# Patient Record
Sex: Female | Born: 1975 | Race: White | Hispanic: No | Marital: Single | Smoking: Current every day smoker
Health system: Southern US, Community
[De-identification: ages and names within clinical notes are randomized; demographics above are authoritative.]

## PROBLEM LIST (undated history)

## (undated) DIAGNOSIS — E039 Hypothyroidism, unspecified: Secondary | ICD-10-CM

## (undated) DIAGNOSIS — F32A Depression, unspecified: Secondary | ICD-10-CM

## (undated) HISTORY — DX: Depression, unspecified: F32.A

## (undated) HISTORY — DX: Hypothyroidism, unspecified: E03.9

---

## 2017-09-04 ENCOUNTER — Emergency Department
Admission: EM | Admit: 2017-09-04 | Discharge: 2017-09-04 | Disposition: A | Discharge status: Home / Self Care | Payer: No Typology Code available for payment source | Attending: Emergency Medicine | Admitting: Emergency Medicine

## 2017-09-04 DIAGNOSIS — Z79899 Other long term (current) drug therapy: Secondary | ICD-10-CM | POA: Insufficient documentation

## 2017-09-04 DIAGNOSIS — Z885 Allergy status to narcotic agent status: Secondary | ICD-10-CM | POA: Insufficient documentation

## 2017-09-04 DIAGNOSIS — F17210 Nicotine dependence, cigarettes, uncomplicated: Secondary | ICD-10-CM | POA: Insufficient documentation

## 2017-09-04 DIAGNOSIS — K3184 Gastroparesis: Secondary | ICD-10-CM | POA: Insufficient documentation

## 2017-09-04 DIAGNOSIS — G8929 Other chronic pain: Secondary | ICD-10-CM | POA: Insufficient documentation

## 2017-09-04 DIAGNOSIS — E039 Hypothyroidism, unspecified: Secondary | ICD-10-CM | POA: Insufficient documentation

## 2017-09-04 LAB — COMPREHENSIVE METABOLIC PANEL
ALT: 13 U/L (ref 0–55)
AST (SGOT): 13 U/L (ref 5–34)
Albumin/Globulin Ratio: 1.4 (ref 0.9–2.2)
Albumin: 3.8 g/dL (ref 3.5–5.0)
Alkaline Phosphatase: 77 U/L (ref 37–106)
Anion Gap: 11 (ref 5.0–15.0)
BUN: 9.4 mg/dL (ref 7.0–19.0)
Bilirubin, Total: 0.1 mg/dL — ABNORMAL LOW (ref 0.2–1.2)
CO2: 26 mEq/L (ref 22–29)
Calcium: 9.5 mg/dL (ref 8.5–10.5)
Chloride: 104 mEq/L (ref 100–111)
Creatinine: 0.7 mg/dL (ref 0.6–1.0)
Globulin: 2.8 g/dL (ref 2.0–3.6)
Glucose: 83 mg/dL (ref 70–100)
Potassium: 3.7 mEq/L (ref 3.5–5.1)
Protein, Total: 6.6 g/dL (ref 6.0–8.3)
Sodium: 141 mEq/L (ref 136–145)

## 2017-09-04 LAB — CBC AND DIFFERENTIAL
Absolute NRBC: 0 10*3/uL
Basophils Absolute Automated: 0.03 10*3/uL (ref 0.00–0.20)
Basophils Automated: 0.4 %
Eosinophils Absolute Automated: 0.16 10*3/uL (ref 0.00–0.70)
Eosinophils Automated: 2.1 %
Hematocrit: 39.8 % (ref 37.0–47.0)
Hgb: 13.1 g/dL (ref 12.0–16.0)
Immature Granulocytes Absolute: 0.01 10*3/uL
Immature Granulocytes: 0.1 %
Lymphocytes Absolute Automated: 2.97 10*3/uL (ref 0.50–4.40)
Lymphocytes Automated: 39.1 %
MCH: 31.8 pg (ref 28.0–32.0)
MCHC: 32.9 g/dL (ref 32.0–36.0)
MCV: 96.6 fL (ref 80.0–100.0)
MPV: 9.8 fL (ref 9.4–12.3)
Monocytes Absolute Automated: 0.55 10*3/uL (ref 0.00–1.20)
Monocytes: 7.2 %
Neutrophils Absolute: 3.87 10*3/uL (ref 1.80–8.10)
Neutrophils: 51.1 %
Nucleated RBC: 0 /100 WBC (ref 0.0–1.0)
Platelets: 279 10*3/uL (ref 140–400)
RBC: 4.12 10*6/uL — ABNORMAL LOW (ref 4.20–5.40)
RDW: 13 % (ref 12–15)
WBC: 7.59 10*3/uL (ref 3.50–10.80)

## 2017-09-04 LAB — URINALYSIS
Bilirubin, UA: NEGATIVE
Blood, UA: NEGATIVE
Glucose, UA: NEGATIVE
Ketones UA: NEGATIVE
Leukocyte Esterase, UA: NEGATIVE
Nitrite, UA: NEGATIVE
Protein, UR: NEGATIVE
Specific Gravity UA: 1.025 (ref 1.001–1.035)
Urine pH: 6.5 (ref 5.0–8.0)
Urobilinogen, UA: 0.2 mg/dL

## 2017-09-04 LAB — GFR: EGFR: >60

## 2017-09-04 LAB — URINE HCG QUALITATIVE: Urine HCG Qualitative: NEGATIVE

## 2017-09-04 MED ORDER — DIPHENHYDRAMINE HCL 50 MG/ML IJ SOLN
INTRAMUSCULAR | Status: AC
Start: 2017-09-04 — End: 2017-09-04
  Administered 2017-09-04: 20:00:00 25 mg via INTRAVENOUS
  Filled 2017-09-04: qty 1

## 2017-09-04 MED ORDER — DIPHENHYDRAMINE HCL 25 MG PO CAPS
25.00 mg | ORAL_CAPSULE | Freq: Once | ORAL | Status: DC
Start: 2017-09-04 — End: 2017-09-04

## 2017-09-04 MED ORDER — DIPHENHYDRAMINE HCL 50 MG/ML IJ SOLN
12.50 mg | Freq: Once | INTRAMUSCULAR | Status: DC
Start: 2017-09-04 — End: 2017-09-04

## 2017-09-04 MED ORDER — KETOROLAC TROMETHAMINE 30 MG/ML IJ SOLN
30.00 mg | Freq: Once | INTRAMUSCULAR | Status: AC
Start: 2017-09-04 — End: 2017-09-04
  Administered 2017-09-04: 20:00:00 30 mg via INTRAVENOUS
  Filled 2017-09-04: qty 1

## 2017-09-04 MED ORDER — METOCLOPRAMIDE HCL 5 MG/ML IJ SOLN
10.00 mg | Freq: Once | INTRAMUSCULAR | Status: AC
Start: 2017-09-04 — End: 2017-09-04
  Administered 2017-09-04: 20:00:00 10 mg via INTRAVENOUS
  Filled 2017-09-04: qty 2

## 2017-09-04 MED ORDER — SODIUM CHLORIDE 0.9 % IV BOLUS
1000.00 mL | Freq: Once | INTRAVENOUS | Status: AC
Start: 2017-09-04 — End: 2017-09-04
  Administered 2017-09-04: 20:00:00 1000 mL via INTRAVENOUS

## 2017-09-04 MED ORDER — DIPHENHYDRAMINE HCL 50 MG/ML IJ SOLN
25.00 mg | Freq: Once | INTRAMUSCULAR | Status: AC
Start: 2017-09-04 — End: 2017-09-04

## 2017-09-04 MED ORDER — DIPHENHYDRAMINE HCL 50 MG/ML IJ SOLN
25.00 mg | Freq: Once | INTRAMUSCULAR | Status: DC
Start: 2017-09-04 — End: 2017-09-04

## 2017-09-04 NOTE — Discharge Instructions (Signed)
Your blood tests in the ER were normal.    Stay well-hydrated and get plenty of rest.    Use home nausea medications as needed.    We recommended smaller meals throughout the day since you have recently been diagnosed with gastroparesis.    See your primary care doctor and GI doctor for follow up in 2-3 days    Return to the ER for any further concerns.          Gastroparesis    Gastroparesis means that it takes longer than usual for food to move from your stomach into your intestines. It happens when the nerves that control the stomach muscles are damaged. The nerve damage could be caused by diabetes, surgery to the stomach or intestines, or by some medicines. Sometimes we cannot figure out the cause.    Gastroparesis is usually a chronic condition. This means treatment is very unlikely to cure it. Instead, your doctor can help you manage the symptoms.    Gastroparesis is usually a chronic condition. This means treatment is very unlikely to cure it. Instead, your doctor can help you manage the symptoms.    Symptoms of gastroparesis include:   Abdominal (belly) pain or cramping, especially in the upper abdomen (belly).   Nausea or vomiting.   Feeling full after eating only a small amount of food.   Heartburn or acid reflux.   High or low blood sugars which are hard to control.    To feel better, try to:   Drink more liquids and eat soft foods.   Drink plenty of water.   Eat smaller, more frequent meals.   Avoid high-fat foods.   Avoid high fiber foods.    Follow up with your primary care doctor or your stomach specialist.    YOU SHOULD SEEK MEDICAL ATTENTION IMMEDIATELY, EITHER HERE OR AT THE NEAREST EMERGENCY DEPARTMENT, IF ANY OF THE FOLLOWING OCCUR:   Your pain changes or gets worse.   You have problems eating or drinking.   You have vomiting that won't stop.   Your symptoms get worse or you have any other problems or concerns.

## 2017-09-04 NOTE — ED Provider Notes (Signed)
[facility] EMERGENCY DEPARTMENT H&P      Visit date: 09/04/2017      CLINICAL SUMMARY           Diagnosis:    .     Final diagnoses:   Gastroparesis         MDM Notes:      42 yo F h/o recently dx'd gastroparesis, p/w worsening of chronic abd pain and n/v/d.  Labs wnl.  Pt given IV Reglan, Benadryl, and Toradol with improvement in sx.  Discussed home supportive care, recommend close PCP and GI f/u.         Disposition:         Discharge         Discharge Prescriptions     None                      CLINICAL INFORMATION        HPI:      Chief Complaint: Abdominal Pain  .    Kathy Lester is a 41 y.o. female who presents with c/o intermittent nausea and abdominal pain over the past 5 months, acutely worse over the past 4 days. Pt reports recent dx of gastroparesis and reports near constant pain centered in LLQ abdomen due to the gastroparesis. She also c/o vomiting, chills, and diarrhea over the past 5 days and expresses concern over dehydration.     She denies measured fever, CP, SOB, HA, or numbness/tingling.     Tried Zofran and Phenergan at home, without relief of sx.    History obtained from: Patient          ROS:      Positive and negative ROS elements as per HPI.  All other systems reviewed and negative.      Physical Exam:      Pulse 99  BP 131/71  Resp 16  SpO2 100 %  Temp 98.3 F (36.8 C)    Constitutional: Vital signs reviewed. Well appearing.  Head: Normocephalic, atraumatic  Eyes:  EOMI. No conjunctival injection. No discharge.  ENT: mildly dry mucous membranes, no drooling or trismus  Neck: Normal range of motion. Supple, no meningismus  Respiratory/Chest: Clear to auscultation, no w/r/r. No respiratory distress.   Cardiovascular: Regular rate and rhythm. No significant murmur appreciated.   Abdomen: Soft. No guarding. No peritoneal signs. Mild TTP in LLQ abdomen.  UpperExtremity: Normal ROM, no deformity.  LowerExtremity: Normal ROM, no deformity. No edema    Neurological:  GCS 15.  Speech normal. Moves extremities equally. No facial droop.   Skin: Warm and dry. No rash.  Psychiatric: Normal affect. Normal concentration.             PAST HISTORY        Primary Care Provider: Christa See, MD        PMH/PSH:    .     Past Medical History:   Diagnosis Date   . Depression    . Hypothyroid        She has a past surgical history that includes Sinus surgery.      Social/Family History:      She reports that she has been smoking Cigarettes.  She has been smoking about 0.50 packs per day. She has never used smokeless tobacco. She reports that she does not drink alcohol or use drugs.    History reviewed. No pertinent family history.      Listed Medications on Arrival:    .  Home Medications     Med List Status:  Complete Set By: Calton Dach, RN at 09/04/2017  7:33 PM                citalopram (CELEXA) 40 MG tablet     Take 40 mg by mouth daily.     clonazePAM (KLONOPIN) 0.5 MG tablet     Take 0.5 mg by mouth 2 (two) times daily as needed.     levothyroxine (SYNTHROID, LEVOTHROID) 175 MCG tablet     Take 175 mcg by mouth daily.     omeprazole (PRILOSEC) 40 MG capsule     Take 40 mg by mouth daily.     ondansetron (ZOFRAN) 8 MG tablet     Take by mouth every 8 (eight) hours as needed for Nausea.     prochlorperazine (COMPAZINE) 10 MG tablet     Take 10 mg by mouth every 6 (six) hours as needed.         Allergies: She is allergic to codeine.            VISIT INFORMATION        Clinical Course in the ED:        Assessment & Plan:  42 y.o.  female presents with abd pain and n/v    Differential: gastroparesis, UTI, dehydration, electrolyte abnormality, pregnancy.     Plan:   Check labs, electrolytes, UA, hcg  Give reglan, toradol, benadryl  Give IVF bolus   Reassess and dispo after treatment/results    8:55 PM:   Labs wnl with no signs of ARI or electrolyte abnorm  Suspect acute exacerbation of gastroparesis  Pt feels much improved after meds and fluids, she is  comfortable with d/c home.  Discussed home supportive care  Recommend close PCP/GI f/u  Pt well-appearing, ambulating, tolerating PO  NAD at time of [place]         Medications Given in the ED:    .     ED Medication Orders     Start Ordered     Status Ordering Provider    09/04/17 2014 09/04/17 2013  diphenhydrAMINE (BENADRYL) injection 25 mg  Once     Route: Intravenous  Ordered Dose: 25 mg     Last MAR action:  Given Rennis Petty [facility]    09/04/17 2003 09/04/17 2002    Once     Route: Oral  Ordered Dose: 25 mg     Discontinued Rennis Petty [facility]    09/04/17 1958 09/04/17 1957    Once     Route: Intravenous  Ordered Dose: 25 mg     Discontinued Rennis Petty [facility]    09/04/17 1955 09/04/17 1954    Once     Route: Oral  Ordered Dose: 25 mg     Discontinued Rennis Petty [facility]    09/04/17 1946 09/04/17 1945  sodium chloride 0.9 % bolus 1,000 mL  Once     Route: Intravenous  Ordered Dose: 1,000 mL     Last MAR action:  New Bag Rennis Petty [facility]    09/04/17 1946 09/04/17 1945  metoclopramide (REGLAN) injection 10 mg  Once     Route: Intravenous  Ordered Dose: 10 mg     Last MAR action:  Given Rennis Petty [facility]    09/04/17 1946 09/04/17 1945  ketorolac (TORADOL) injection 30 mg  Once     Route: Intravenous  Ordered Dose: 30 mg     Last MAR action:  Given Rennis Petty [facility]    09/04/17 1946 09/04/17 1945    Once     Route: Intravenous  Ordered Dose: 12.5 mg     Discontinued Annais Crafts SHAIKH            Procedures:      Procedures      Interpretations:         O2 sat-           saturation: 100 %; Oxygen use: room air; Interpretation: Normal                 RESULTS        Lab Results:      Results     Procedure Component Value Units Date/Time    Comprehensive metabolic panel [[id number]]  (Abnormal) Collected:  09/04/17 2000    Specimen:  Blood Updated:  09/04/17 2028     Glucose 83 mg/dL      BUN 9.4 mg/dL      Creatinine 0.7 mg/dL      Sodium 409 mEq/L      Potassium 3.7 mEq/L      Chloride 104 mEq/L      CO2  26 mEq/L      Calcium 9.5 mg/dL      Protein, Total 6.6 g/dL      Albumin 3.8 g/dL      AST (SGOT) 13 U/L      ALT 13 U/L      Alkaline Phosphatase 77 U/L      Bilirubin, Total 0.1 (L) mg/dL      Globulin 2.8 g/dL      Albumin/Globulin Ratio 1.4     Anion Gap 11.0    GFR [[id number]] Collected:  09/04/17 2000     Updated:  09/04/17 2028     EGFR >60.0    CBC with differential [[id number]]  (Abnormal) Collected:  09/04/17 2000    Specimen:  Blood from Blood Updated:  09/04/17 2007     WBC 7.59 x10 3/uL      Hgb 13.1 g/dL      Hematocrit 57.8 %      Platelets 279 x10 3/uL      RBC 4.12 (L) x10 6/uL      MCV 96.6 fL      MCH 31.8 pg      MCHC 32.9 g/dL      RDW 13 %      MPV 9.8 fL      Neutrophils 51.1 %      Lymphocytes Automated 39.1 %      Monocytes 7.2 %      Eosinophils Automated 2.1 %      Basophils Automated 0.4 %      Immature Granulocyte 0.1 %      Nucleated RBC 0.0 /100 WBC      Neutrophils Absolute 3.87 x10 3/uL      Abs Lymph Automated 2.97 x10 3/uL      Abs Mono Automated 0.55 x10 3/uL      Abs Eos Automated 0.16 x10 3/uL      Absolute Baso Automated 0.03 x10 3/uL      Absolute Immature Granulocyte 0.01 x10 3/uL      Absolute NRBC 0.00 x10 3/uL     UA with reflex to micro (pts  3 + yrs) [[id number]]  (Abnormal) Collected:  09/04/17 1950    Specimen:  Urine from Urine, Clean Catch Updated:  09/04/17 2004     Urine Type  Clean Catch     Color, UA Yellow     Clarity, UA Sl Cloudy (A)     Specific Gravity UA 1.025     Urine pH 6.5     Leukocyte Esterase, UA Negative     Nitrite, UA Negative     Protein, UR Negative     Glucose, UA Negative     Ketones UA Negative     Urobilinogen, UA 0.2 mg/dL      Bilirubin, UA Negative     Blood, UA Negative    Urine HCG, Qualitative [[id number]] Collected:  09/04/17 1950    Specimen:  Urine Updated:  09/04/17 2004     Urine HCG Qualitative Negative              Radiology Results:      No orders to display               Scribe Attestation:      Attestations:  I was acting as a  Neurosurgeon for Lenny Pastel, MD on [facility] MICHELLE  Reagan Sherlon Handing     I am the first provider for this patient and I personally performed the services documented. Treatment Team: Scribe: Oretha Ellis is scribing for me on Kreger,Chelsae MICHELLE. This note and the patient instructions accurately reflect work and decisions made by me.  Reagan Romero Liner, Sherrell Puller, MD  09/04/17 2103

## 2017-09-14 IMAGING — CT CTA Abdomen-Pelvis
2 of 5 series · 14 of 46 positions shown, 18 images · non-contrast
Comparison: none

Procedure requested: CT angiogram of the abdominal aorta.
CLINICAL HISTORY: Lower abdominal pain. Constipation and diarrhea. Weight                 
 loss.
TECHNIQUE: Multiple axial images were obtained from the lung bases to the                 
 symphysis pubis utilizing the CTA for abdominal aortic protocol. Coronal,                 
 sagittal, 3-D and volume rendered images were obtained from this dataset on a             
 workstation and interpreted as part of this exam.

[Series 2: cta ap · axial · 0.70mm/px · z∈[-485,-91]mm · 11 of 359 slices shown, 15 images]
[im 29/359  soft-tissue]
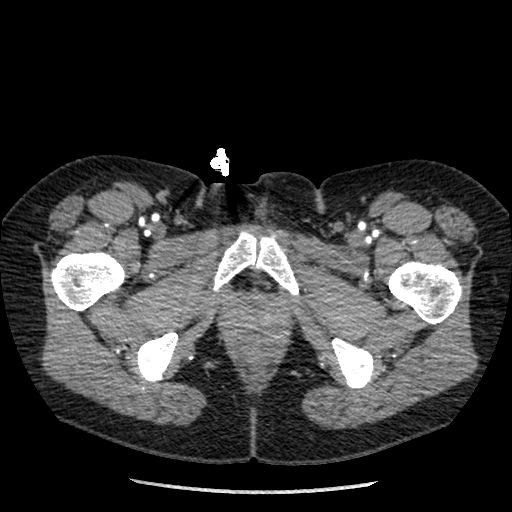
[im 29/359  bone]
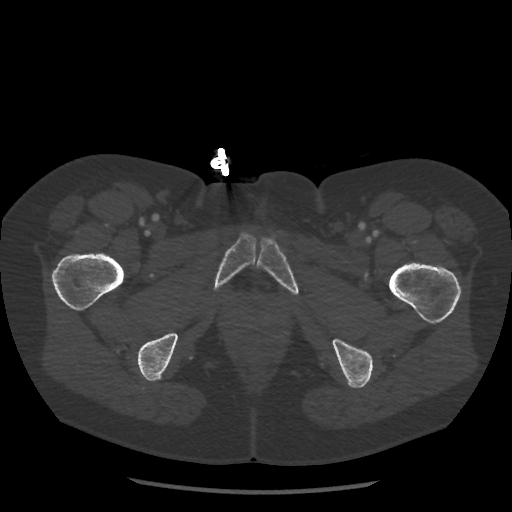
[im 72/359  soft-tissue]
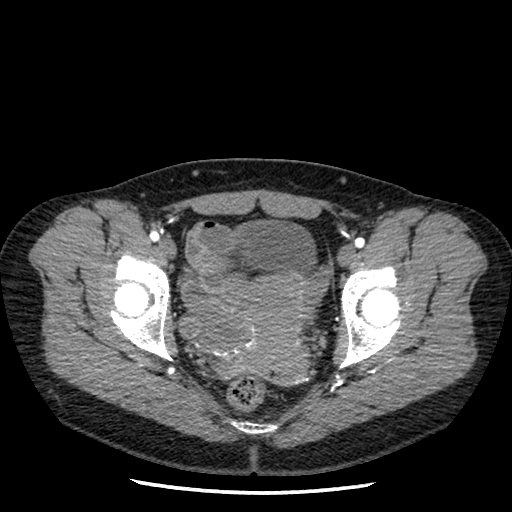
[im 101/359  soft-tissue]
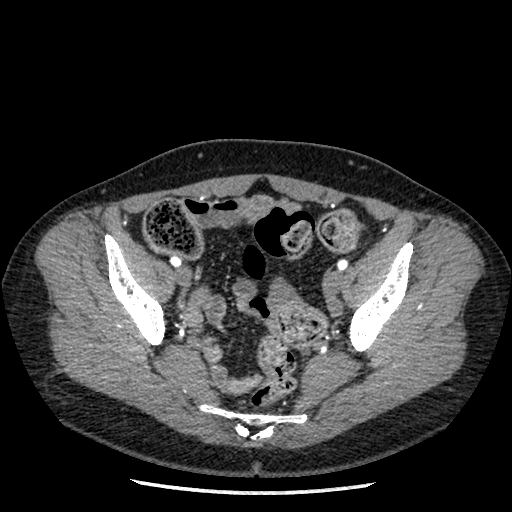
[im 144/359  soft-tissue]
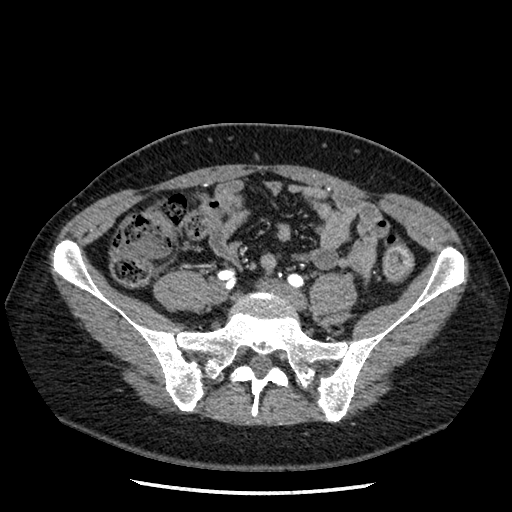
[im 187/359  soft-tissue]
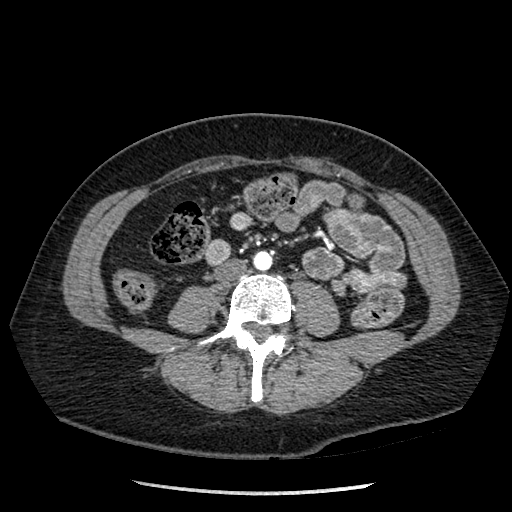
[im 215/359  soft-tissue]
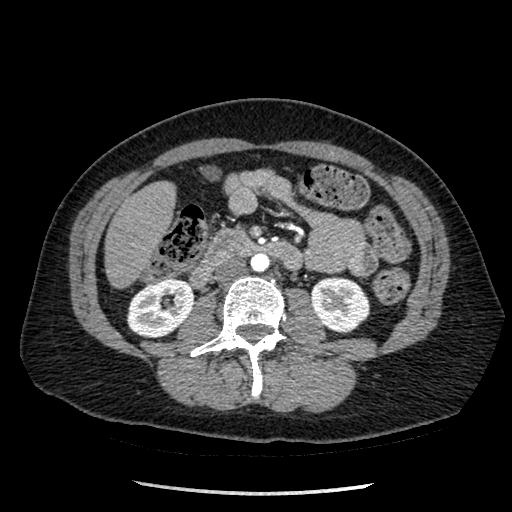
[im 258/359  soft-tissue]
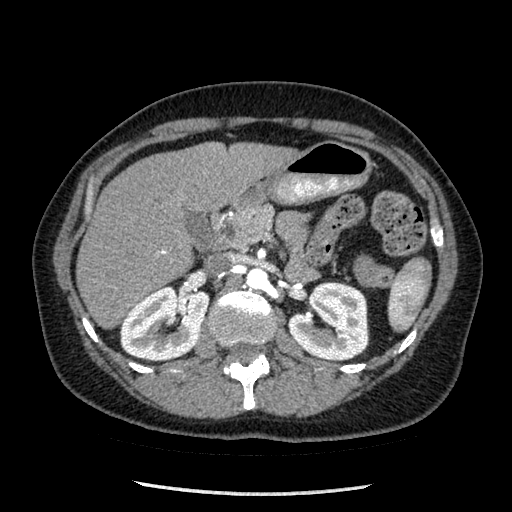
[im 301/359  soft-tissue]
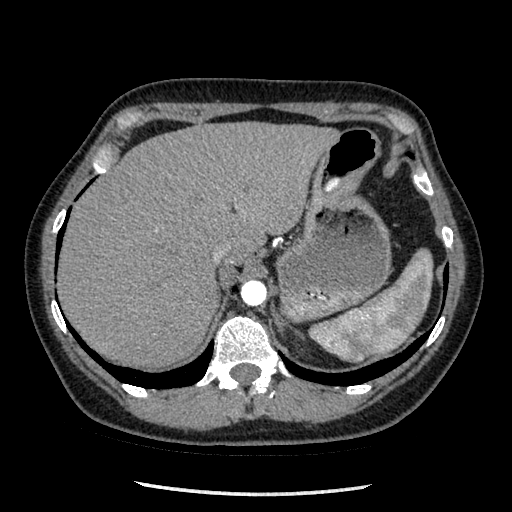
[im 301/359  lung]
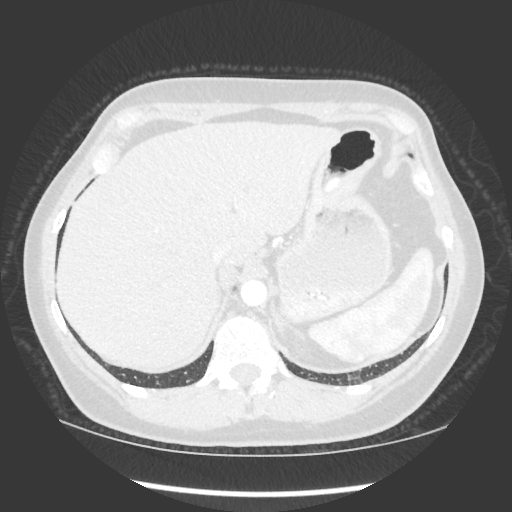
[im 316/359  lung]
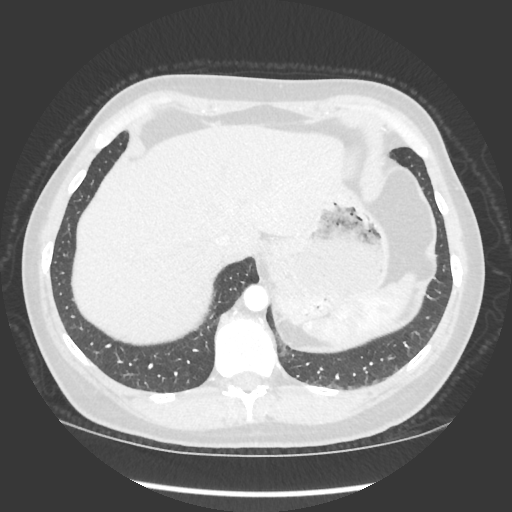
[im 330/359  soft-tissue]
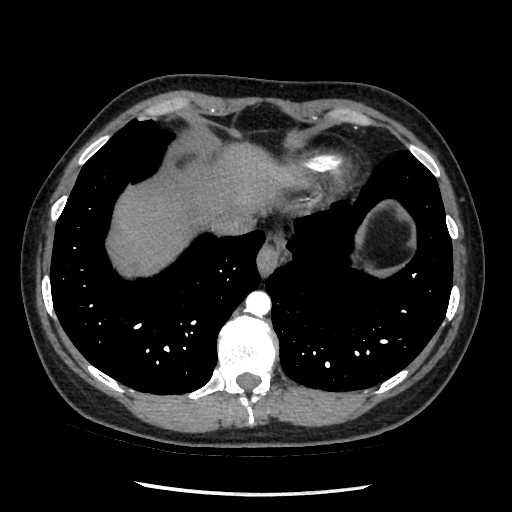
[im 330/359  lung]
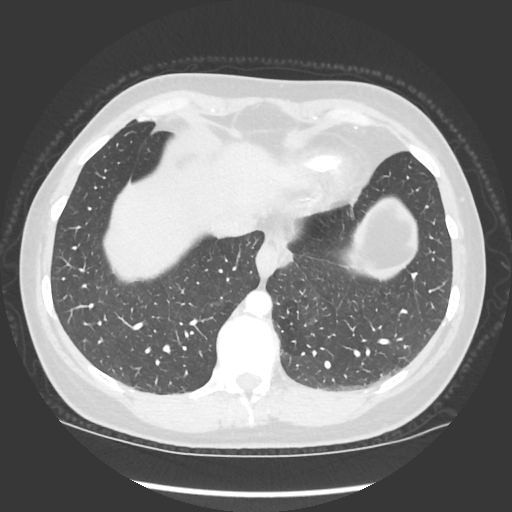
[im 330/359  bone]
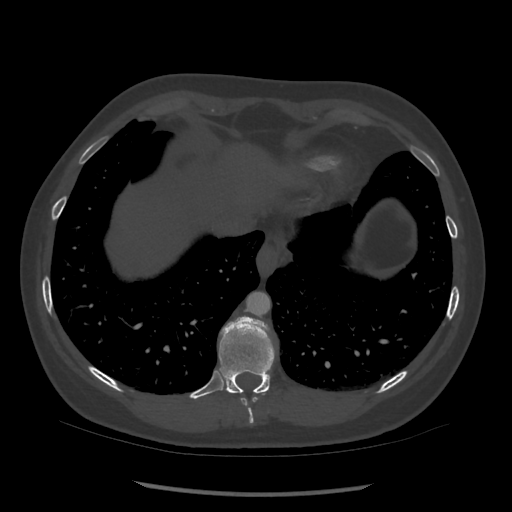
[im 344/359  lung]
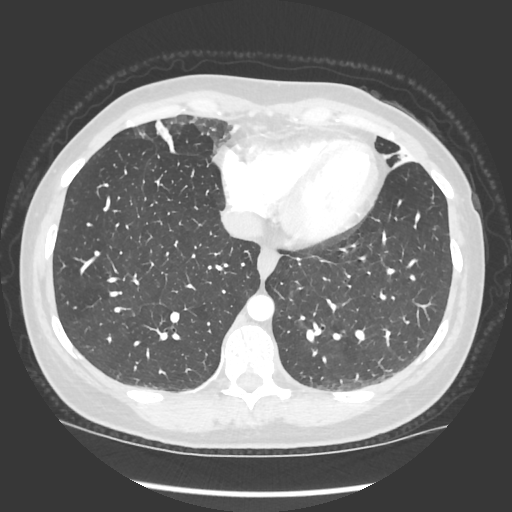

[Series 601: cor stnd 2x2 · coronal · 0.87mm/px · 3 of 144 slices shown]
[im 48/144  soft-tissue]
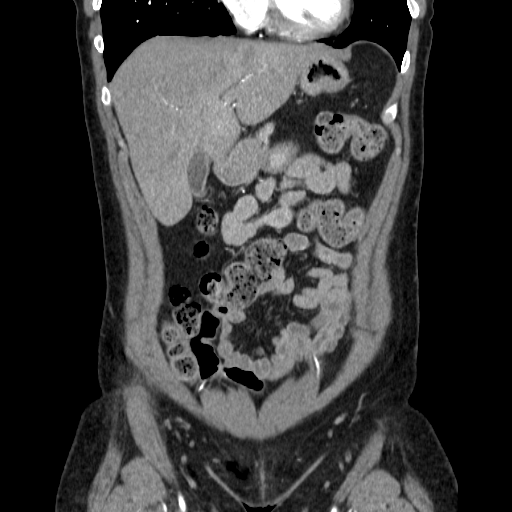
[im 64/144  soft-tissue]
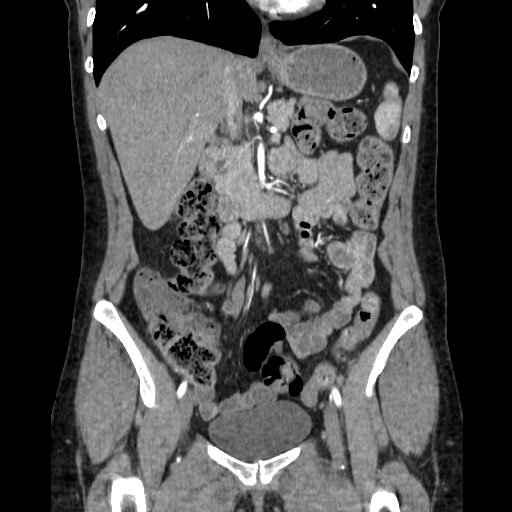
[im 80/144  soft-tissue]
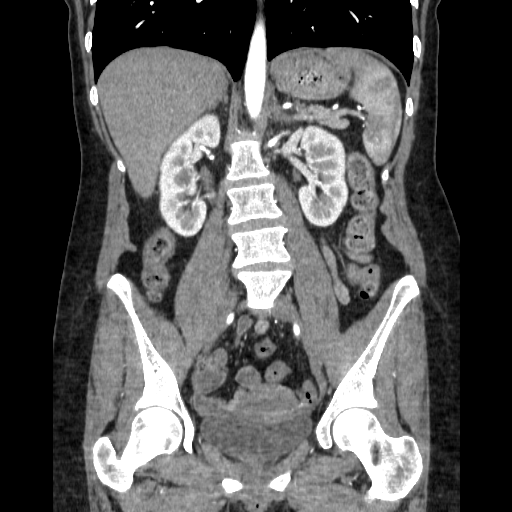

[14 of 46 positions shown; findings below may reference images not displayed]

FINDINGS: This represents an adequate CT angiogram study.                                 
 Lung bases: There is no acute infiltrate, pleural effusion or                             
 pneumothorax.                                                                             
 Hepatobiliary: The liver appears unremarkable. There is no intrahepatic or extra          
 hepatic biliary dilatation. The gallbladder appears unremarkable. The pancreas            
 is intact.                                                                                
 Lymphatics: Spleen is normal in size and configuration. No significant abdominal          
 or pelvic lymphadenopathy seen.                                                           
 GU: The adrenal glands are normal. There is no hydronephrosis or hydroureter.             
 The bladder appears unremarkable. The uterus is present.                                  
 The GI: There is a nonobstructive bowel gas pattern. No free fluid or free air            
 is identified. No abscess formation seen.                                                 
 Musculoskeletal: Intact.                                                                  
 Vascular: There is no evidence of celiac or SMA artery stenosis. There are 2              
 right and two left renal arteries present. This is a normal variant. The renal            
 arteries are widely patent. The infrarenal abdominal aorta appears patent. The            
 IMA artery is patent. The common internal and external iliac arteries are                 
 patent. The common femoral arteries patent.                                               
 No aneurysm or dissection is identified.                                                  
 No stenosis is seen within the branch vessels of the superior mesenteric                  
 artery.
IMPRESSION: 1. Essentially unremarkable CT angiogram of the abdomen and pelvis. Specifically          
 no vascular stenosis is identified. No aneurysm or vascular malformation                  
 is                                                                                        
 identified.                                                                               
 See above for details.

## 2017-12-12 IMAGING — CT CT Chest W-Contrast
2 of 5 series · 14 of 36 positions shown, 17 images · IV contrast (omnipaque)
Comparison: Chest x-ray 12/06/2017

CT Chest W-Infusion Contrast
INDICATION: Abn chest xray.                                                              
 Pertinent History: Follow-up abnormal chest x-ray. Cough for 3 weeks. Dyspnea             
 with activity. Patient reports she has been treated with 2 different antibiotics          
 without change.                                                                           
 Surgical History: None                                                                    
 Cancer: None                                                                              
 Smoking status: Current                                                                   
 GFR (past 30 days): Not applicable                                                        
 Metformin: None                                                                           
 Intravenous contrast: 75 mL Omnipaque 350                                                 
 Comments: None
TECHNIQUE: Routine with IV contrast.  Helical acquisition with sagittal and               
 coronal reformats.                                                                        
 Utilized dose reduction techniques include: Automated Exposure Control, vendor            
 specific iterative reconstruction technique

[Series 4: ax post 3 lung · axial · 0.80mm/px · z∈[-331,-43]mm · 12 of 397 slices shown, 15 images]
[im 18/397  mediastinal]
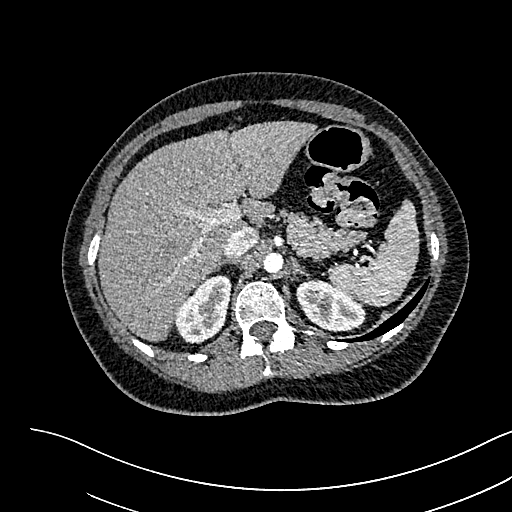
[im 18/397  lung]
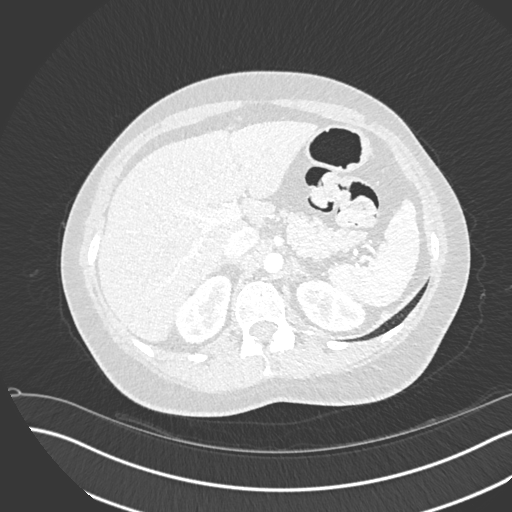
[im 52/397  lung]
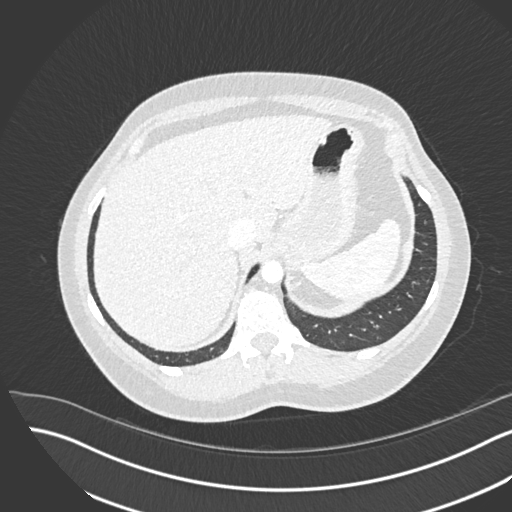
[im 87/397  lung]
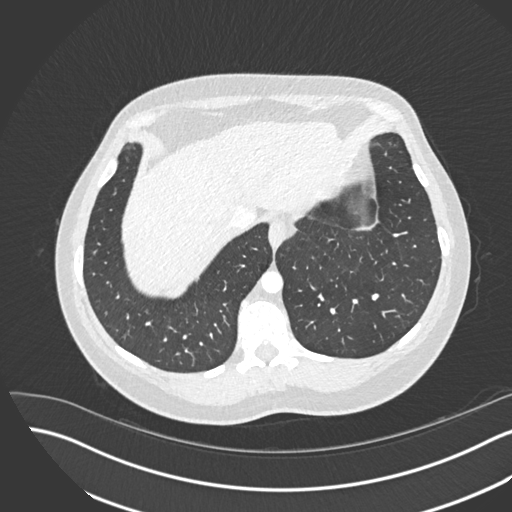
[im 121/397  lung]
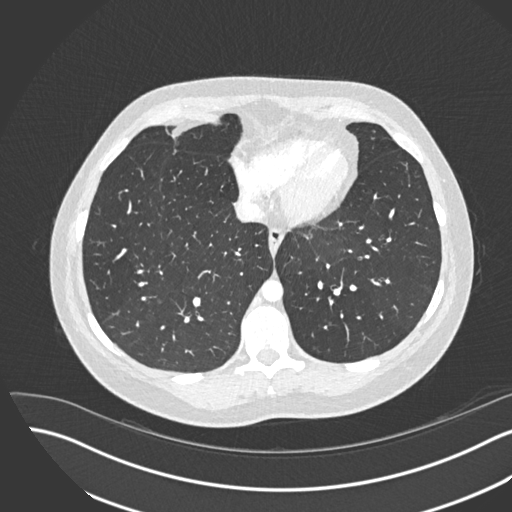
[im 155/397  mediastinal]
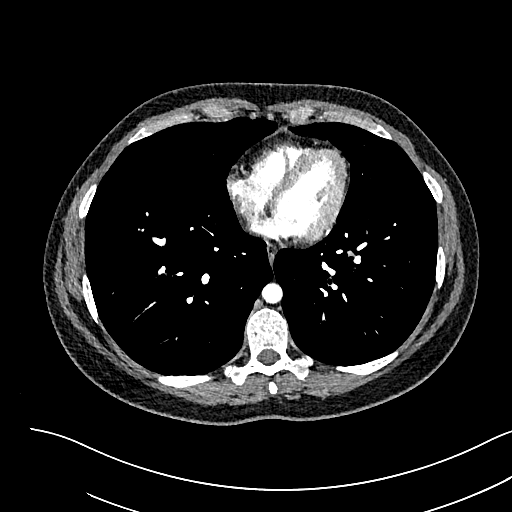
[im 155/397  lung]
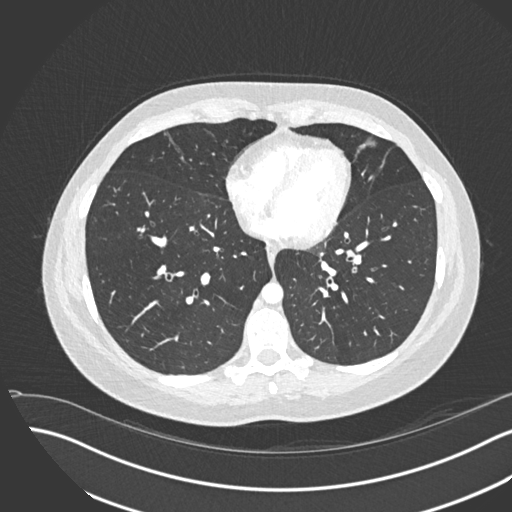
[im 190/397  lung]
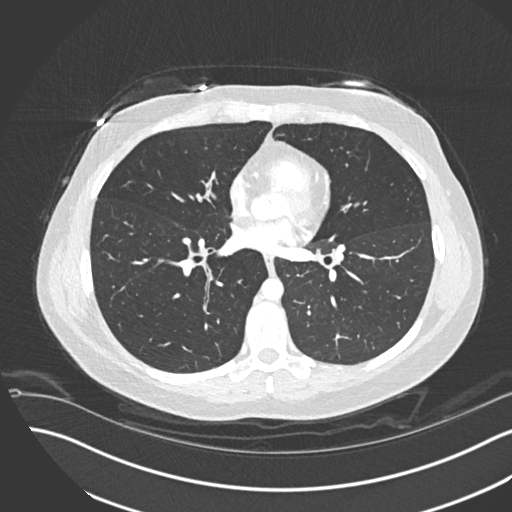
[im 207/397  lung]
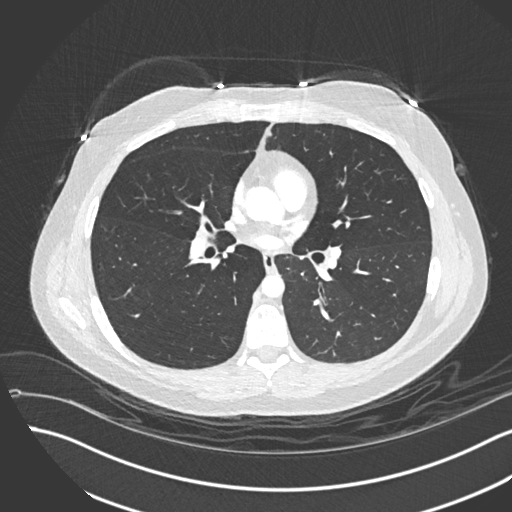
[im 242/397  lung]
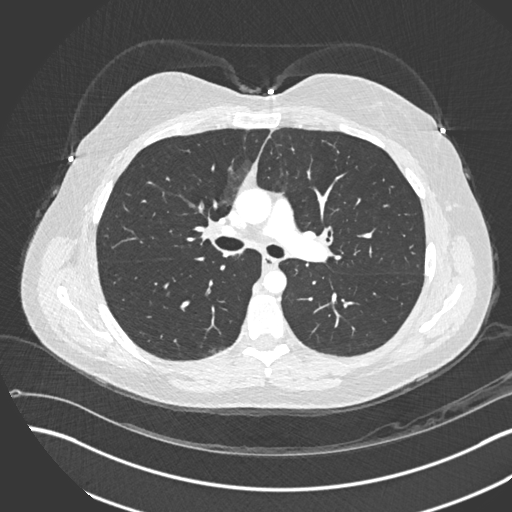
[im 276/397  mediastinal]
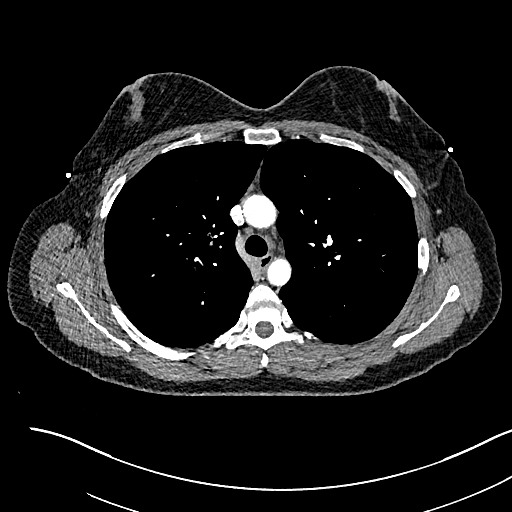
[im 276/397  lung]
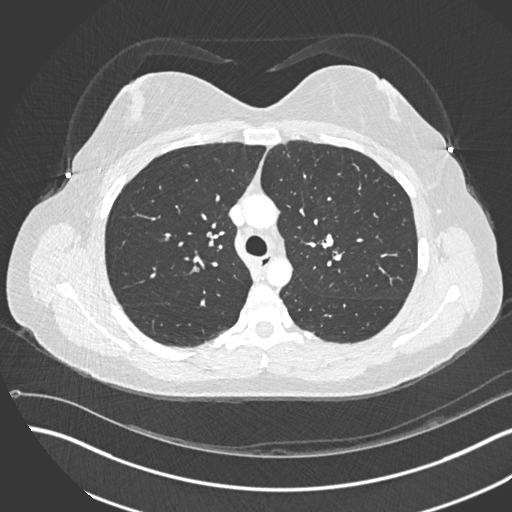
[im 310/397  lung]
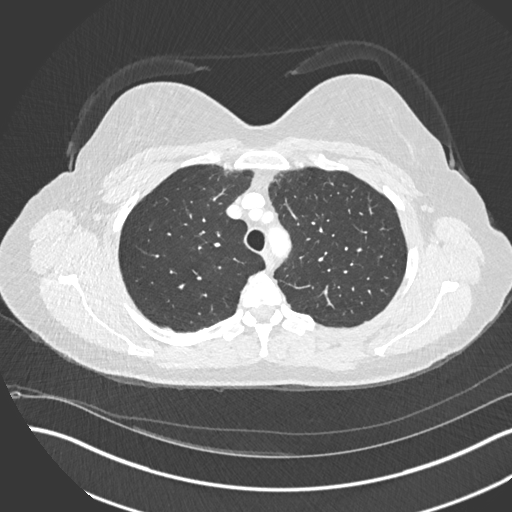
[im 345/397  lung]
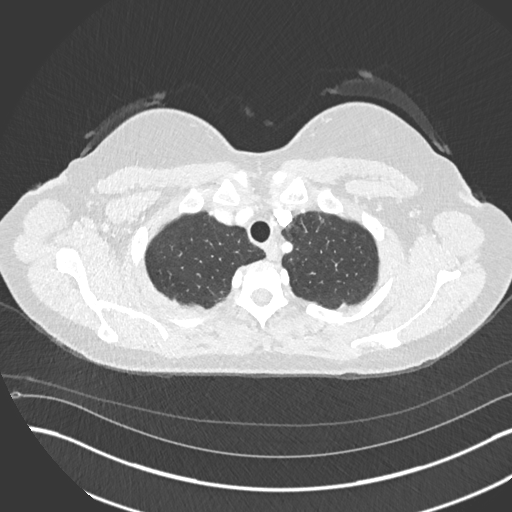
[im 379/397  lung]
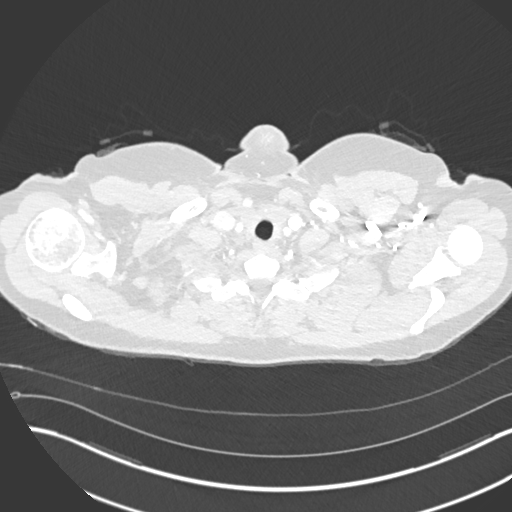

[Series 7: cor 2x2 · coronal · 0.65mm/px · 2 of 129 slices shown]
[im 43/129  lung]
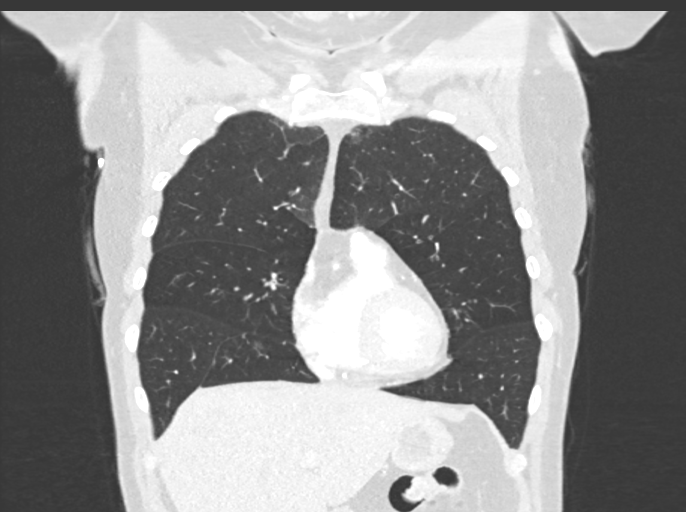
[im 86/129  lung]
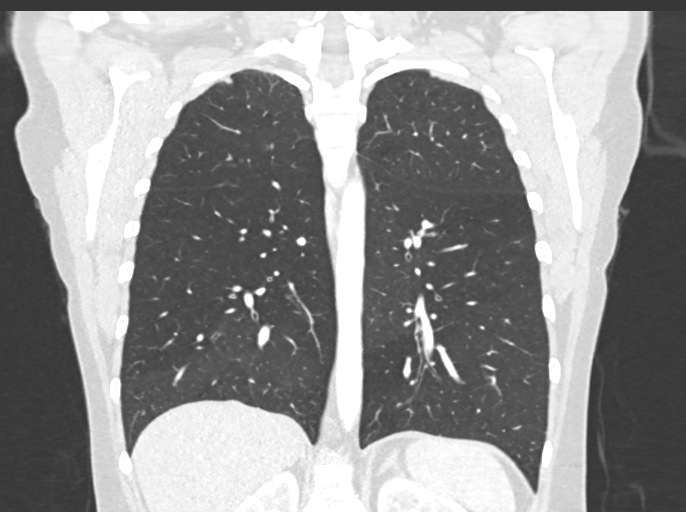

[14 of 36 positions shown; findings below may reference images not displayed]

FINDINGS: No pathologically enlarged axillary or mediastinal lymph nodes. Mildly enlarged           
 right hilar lymph nodes measure up to 1.2 cm in short axis. These are thought to          
 be likely reactive.                                                                       
 No pneumothorax or pleural effusion. There is bronchial wall thickening, most             
 pronounced centrally with mild bronchiolectasis as well. Calcified granuloma at           
 the right lower lobe. The lungs are hyperinflated.                                        
 No aneurysmal dilation of the thoracic aorta or significant pericardial                   
 effusion.                                                                                 
 Views of the included upper abdomen demonstrate normal contour to the bilateral           
 adrenal glands.                                                                           
 No suspicious bony lesions. There are degenerative changes of the                         
 thoracic                                                                                  
 spine.
IMPRESSION: The lungs are hyperinflated, please correlate for asthma. There is predominantly          
 central bronchial wall thickening and mild bronchiolectasis, suggesting acute             
 and chronic airway inflammation.

## 2018-07-06 IMAGING — CR DX Wrist Complete RT
2 series · 4 of 4 positions shown · non-contrast
Comparison: none

Right wrist
CLINICAL HISTORY: Right wrist pain following trauma. Pain along the trapezium             
 deformity distal ulna.

[Series 1: pa · 0.17mm/px · 3 of 3 slices shown]
[im 1/3]
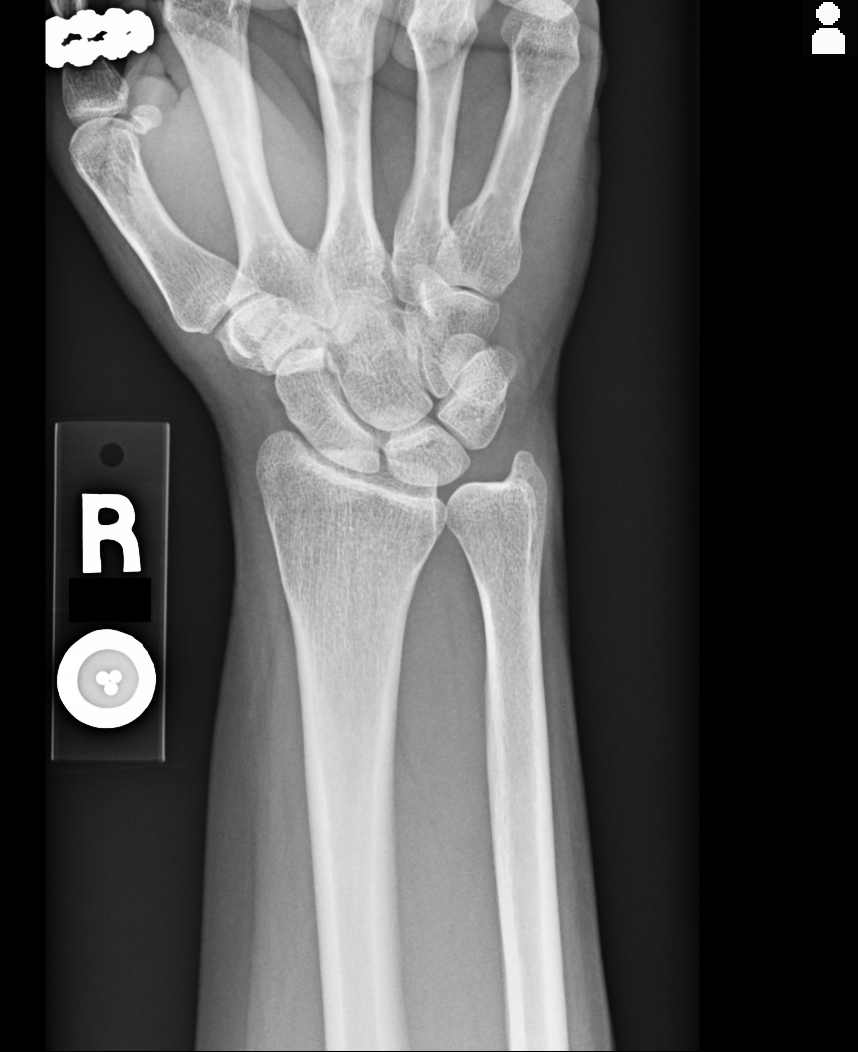
[im 2/3]
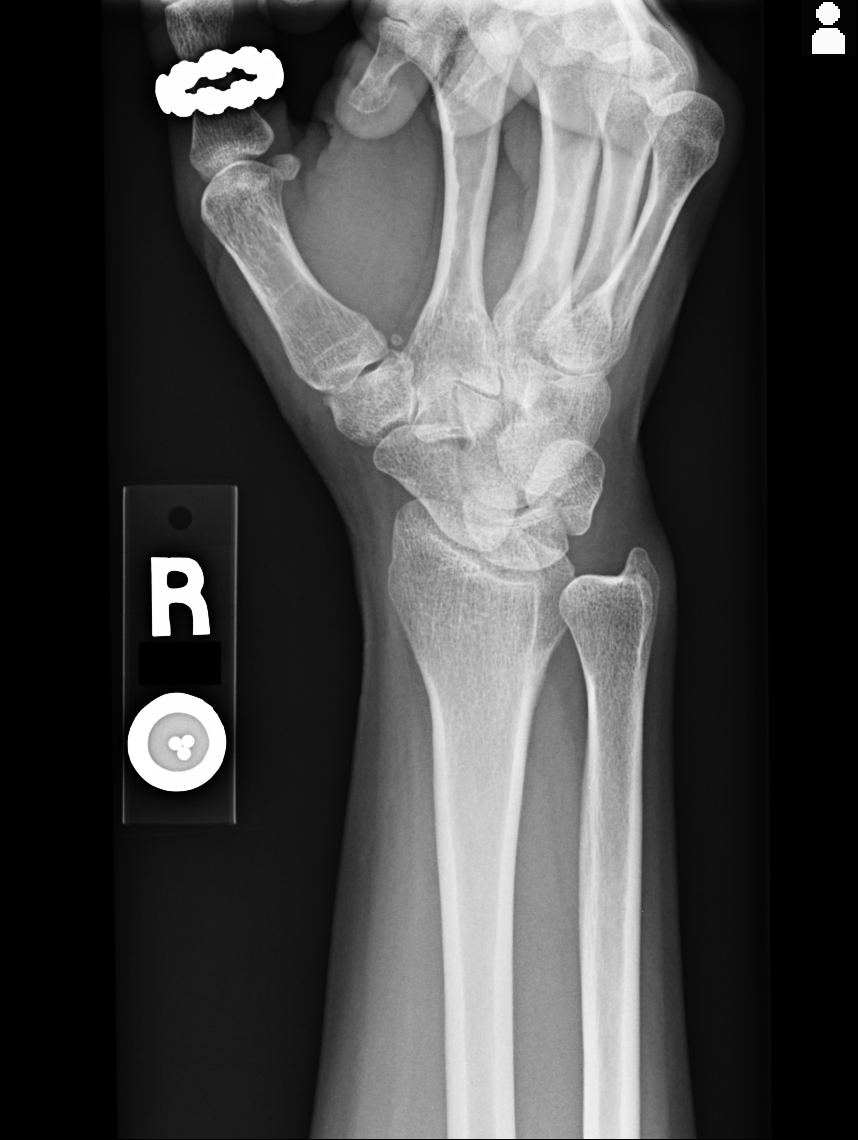
[im 3/3]
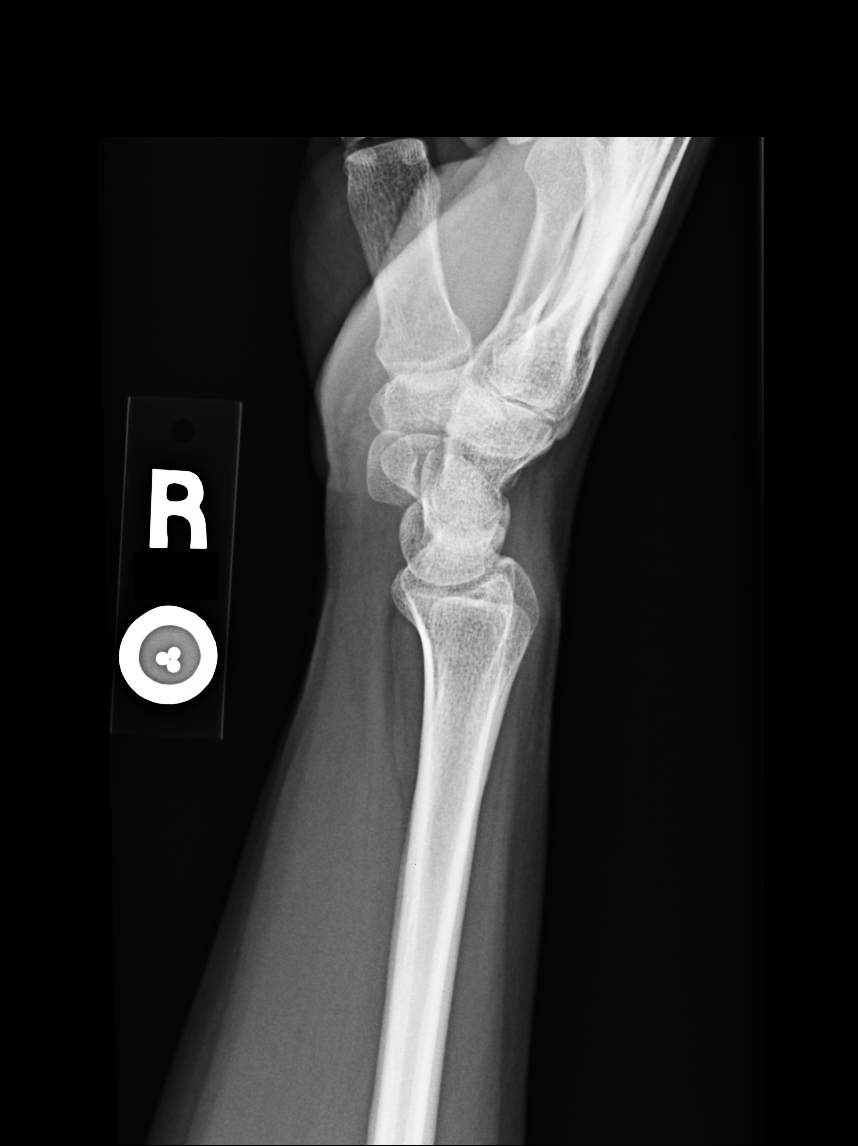

[navicular]
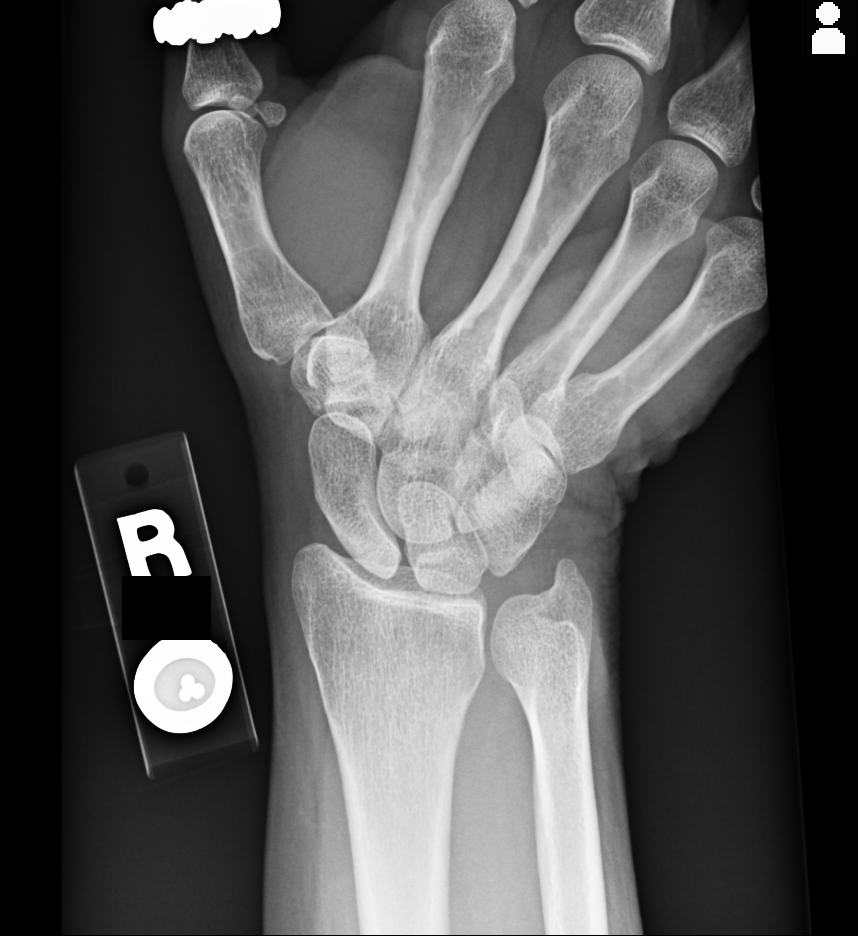

[4 of 4 positions shown; findings below may reference images not displayed]

FINDINGS: 4 views the right wrist submitted.                                                        
 No acute fracture or dislocation.                                                         
 Positive ulnar variance.                                                                  
 No scapholunate diastases.                                                                
 Minimal degenerative joint disease tiny bone fragment adjacent to the ulnar               
 aspects of first BENGT-OVE joint which may reflect sequelae to prior injury or                  
 detached osteophyte.                                                                      
 No soft tissue swelling or evidence radiopaque foreign body.                              
 IMPRESSIONS:                                                                              
 1. Intact right wrist.

## 2019-04-10 IMAGING — MR MR Lumbar Spine W-O Contrast
4 of 5 series · 30 of 48 positions shown · non-contrast
Comparison: None available.

EXAM: MR Lumbar Spine W-O Contrast                                                        
 DATE: 04/10/2019
HISTORY: SCIATICA LEFT SIDE
TECHNIQUE: Multiple MRI pulse sequences of lumbar spine were obtained without             
 contrast.

[Series 2: T2 · sagittal · 4.0mm · 0.68mm/px · 7 of 13 slices shown (1 of 2)]
[im 1/13]
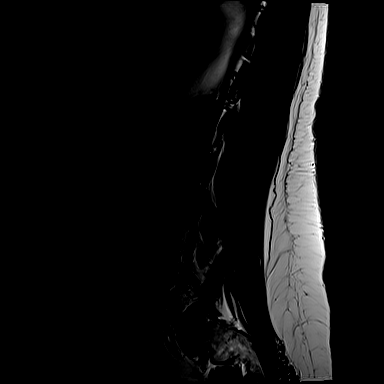
[im 3/13]
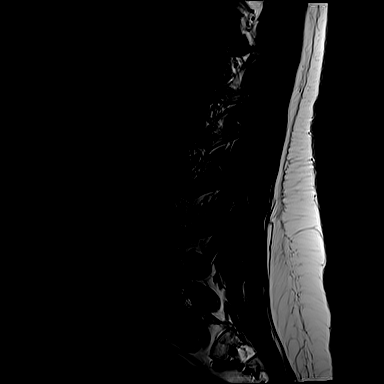
[im 5/13]
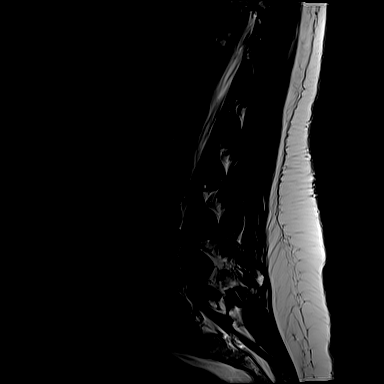
[im 7/13]
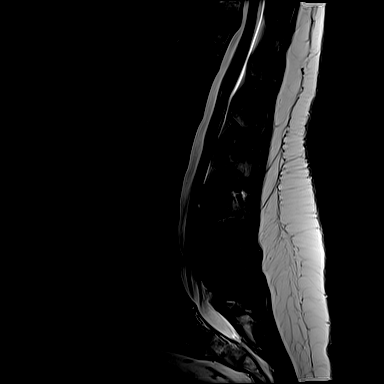
[im 9/13]
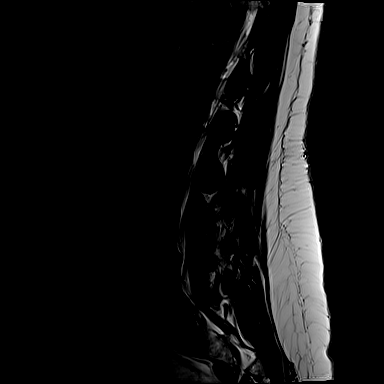
[im 11/13]
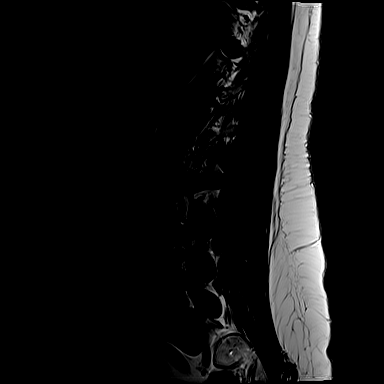
[im 13/13]
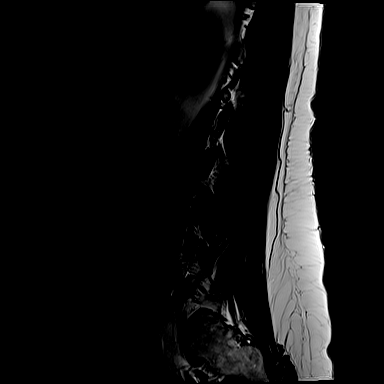

[Series 4: T1 · sagittal · 4.0mm · 0.68mm/px · 8 of 13 slices shown (1 of 2)]
[im 1/13]
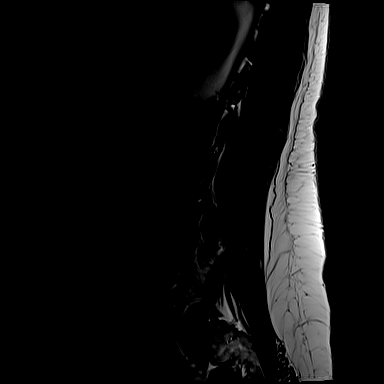
[im 2/13]
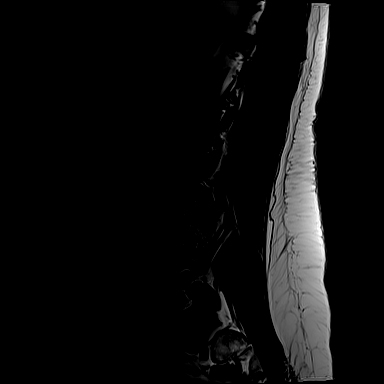
[im 4/13]
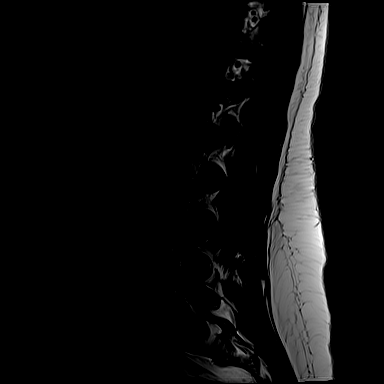
[im 6/13]
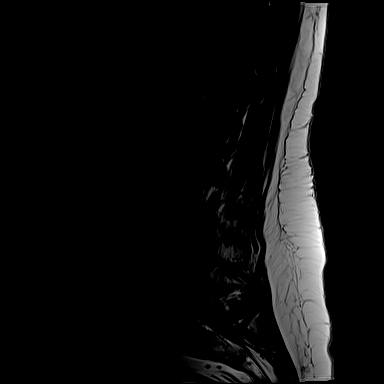
[im 7/13]
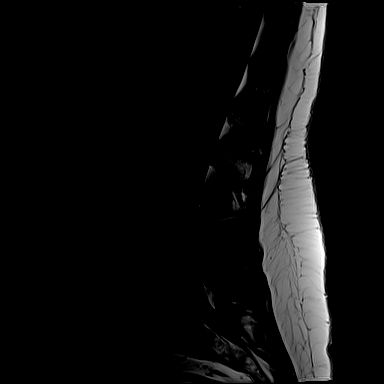
[im 9/13]
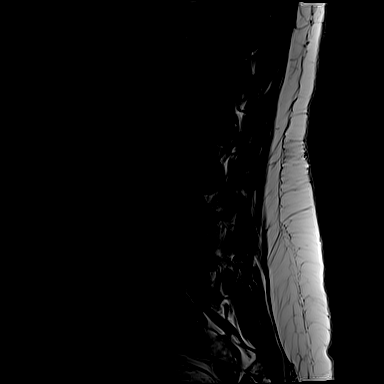
[im 11/13]
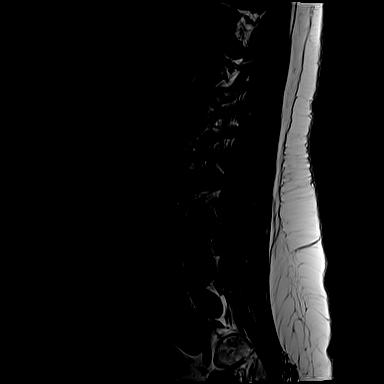
[im 13/13]
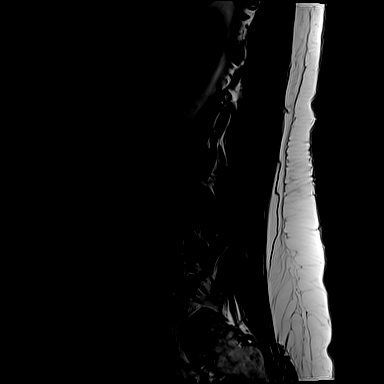

[Series 5: T2 · axial · 4.0mm · 0.56mm/px · z∈[-81,+177]mm · 10 of 26 slices shown (2 of 2)]
[im 2/26]
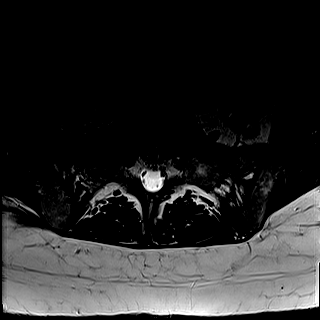
[im 4/26]
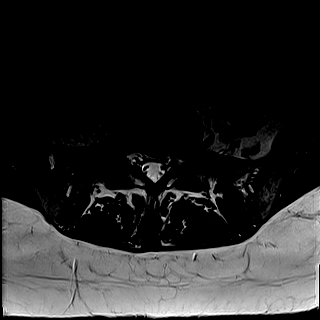
[im 6/26]
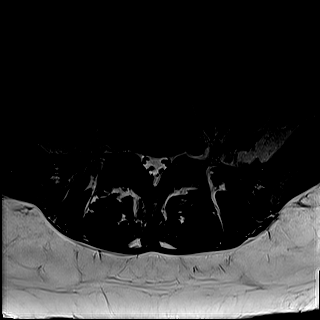
[im 9/26]
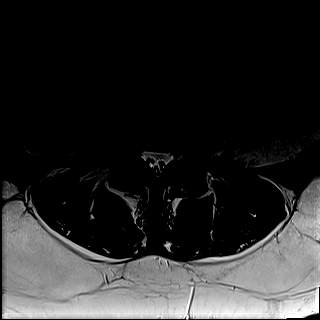
[im 12/26]
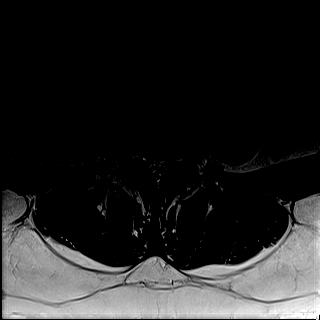
[im 14/26]
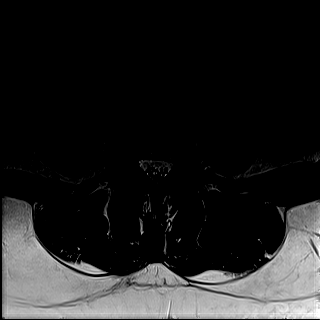
[im 16/26]
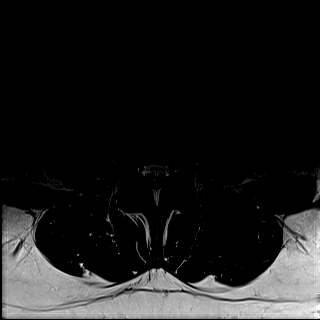
[im 19/26]
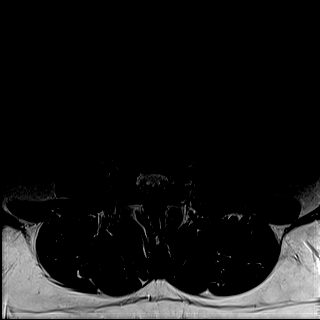
[im 22/26]
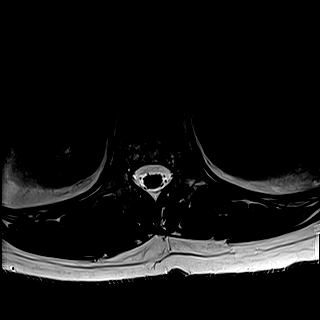
[im 26/26]
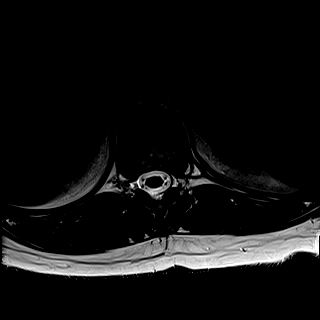

[Series 6: T1 · axial · 4.0mm · 0.35mm/px · z∈[-94,+15]mm · 5 of 15 slices shown (2 of 2)]
[im 1/15]
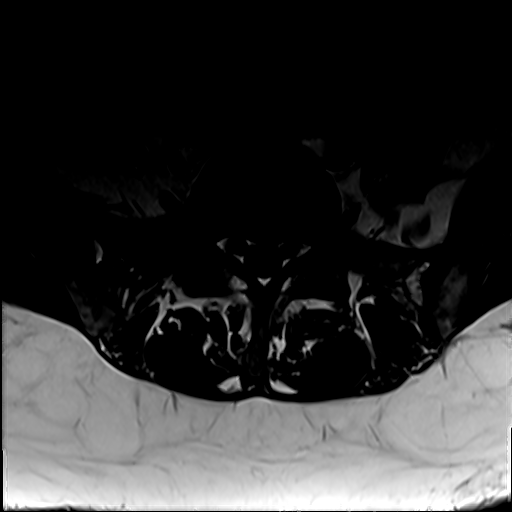
[im 2/15]
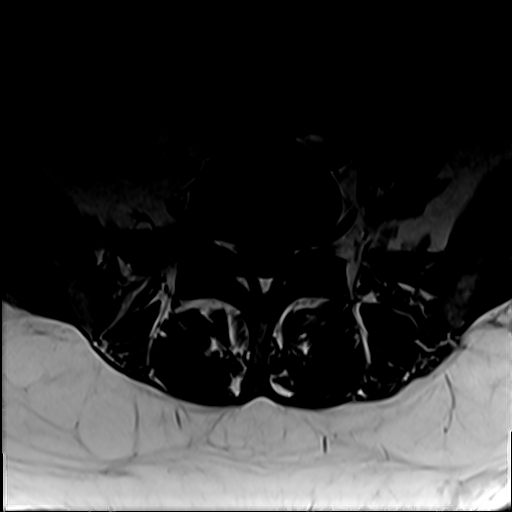
[im 4/15]
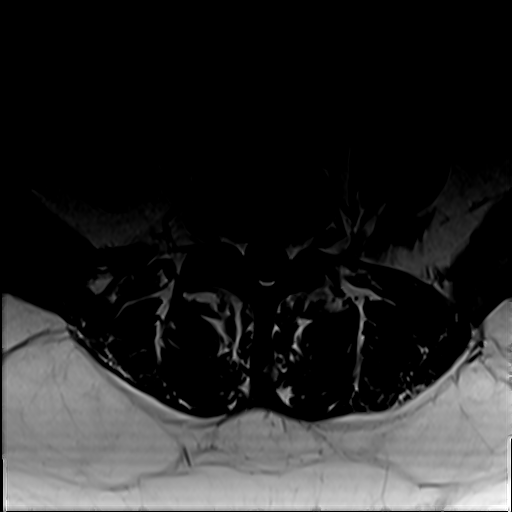
[im 8/15]
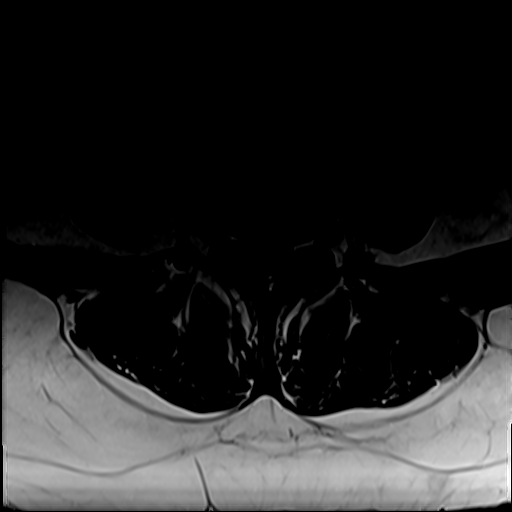
[im 13/15]
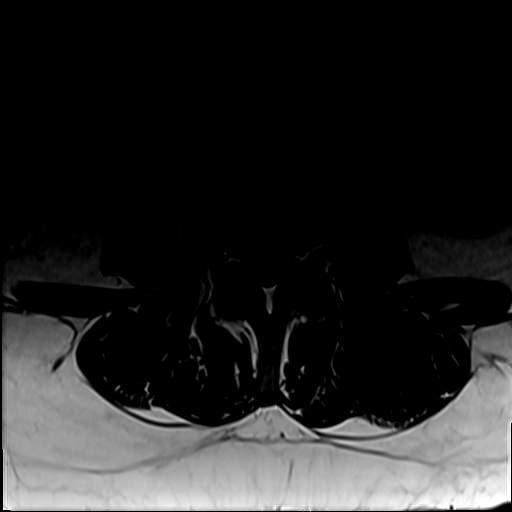

[30 of 48 positions shown; findings below may reference images not displayed]

FINDINGS: Minimal 1 mm grade 1 retrolisthesis of L4 on L5. Bone marrow signal is within normal      
 limits. Conus ends at the L1 level. Moderate disc height loss at T11-T12. Mild to         
 moderate disc height loss at L4-L5. Mild disc height loss at L5-S1. Limited visualization 
 of the retroperitoneal contents grossly unremarkable. Paraspinal musculature is           
 unremarkable.                                                                             
 L1-L2: Unremarkable.                                                                      
 L2-L3: Unremarkable.                                                                      
 L3-L4: Minimal symmetric disc bulge. Mild bilateral facet arthropathy. Ligament flavum    
 hypertrophy. No significant spinal canal narrowing. No neural foraminal stenosis.         
 L4-L5: Mild symmetric disc bulge with tiny central disc extrusion with minimal 3 mm of    
 superior migration. Mild to moderate bilateral facet arthropathy. Ligamentum flavum       
 hypertrophy. There is moderate narrowing of the subarticular zone secondary to facet      
 arthropathy and disc bulge without impingement on the descending nerve roots. Mild        
 central spinal canal narrowing.                                                           
 L5-S1: Mild symmetric disc bulge with central/left paracentral disc protrusion with       
 annular fissure, which contacts the descending left S1 nerve root in the subarticular     
 zone. The disc protrusion encroaches upon the descending right S1 nerve root without      
 significant contact or impingement. Mild to moderate bilateral facet arthropathy. Mild    
 central spinal canal narrowing. Moderate narrowing of the left subarticular zone          
 secondary to disc protrusion and facet arthropathy. Mild bilateral neural foraminal       
 stenosis.
IMPRESSION: Mild to moderate degenerative disc disease at L4-L5 and L5-S1 as described above. Left    
 paracentral disc protrusion at L5-S1 mildly impinges upon the descending left S1 nerve    
 root in the subarticular zone. This could produce a left S1 radiculopathy.

## 2020-11-02 IMAGING — MR MR Lumbar Spine W-O Contrast
4 of 5 series · 31 of 48 positions shown · IV contrast (gadolinium)
Comparison: none

HISTORY: Low back pain radiating to both hips for 2 years. No known surgery.              
 Multiplanar imaging of the lumbar spine was performed without gadolinium.                 
 Comparison is made with previous exam dated April 10, 2019.                               
 This study assumes that there are 5 lumbar type vertebral bodies.                         
 The conus terminates at the L1 level and has normal signal and contour. Vertebral body    
 alignment is within normal limits. No abnormal bone marrow edema. Residual changes of     
 mild to moderate degenerative disc disease present at L4-5 and L5-S1.                     
 The axial images at the L5-S1 level demonstrate minimal broad-based left paramedian disc  
 protrusion, slightly less apparent than previous which abuts but does not significantly   
 compress the thecal sac. Mild to moderate bilateral facet arthropathy. No canal stenosis. 
 The neural foramen are patent.                                                            
 Images at the L4-5 level demonstrate bilateral facet arthropathy with hypertrophy         
 ligamentum flavum. Mild disc bulge with tiny superiorly extruded disc component is stable 
 appearing. Mild canal stenosis at this level, similar to previous. The neural foramen are 
 patent.                                                                                   
 Images at L3-4 demonstrate normal-appearing disc. Mild to moderate facet arthropathy. No  
 focal disc herniation or canal stenosis.                                                  
 The L2-3 and L1-2 discs are unremarkable.

[Series 2: T2 · sagittal · 4.0mm · 0.68mm/px · 7 of 16 slices shown (1 of 2)]
[im 1/16]
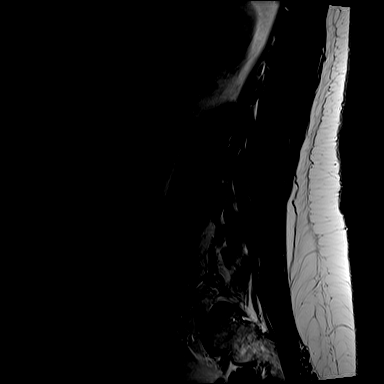
[im 3/16]
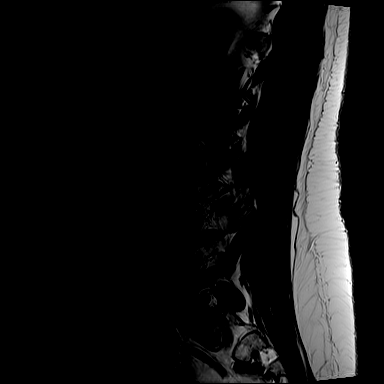
[im 6/16]
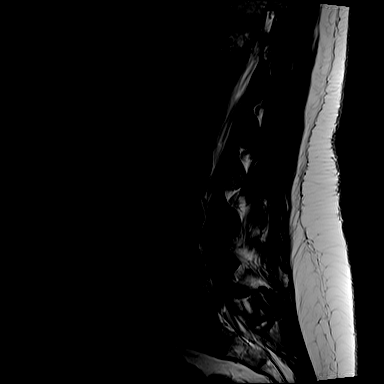
[im 8/16]
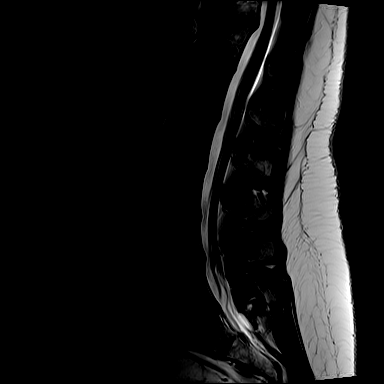
[im 11/16]
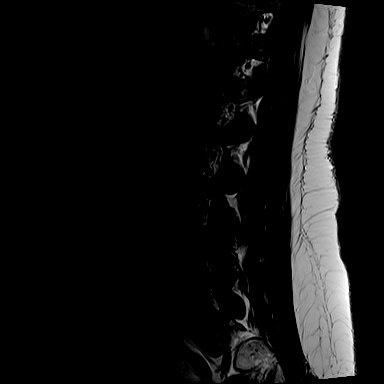
[im 13/16]
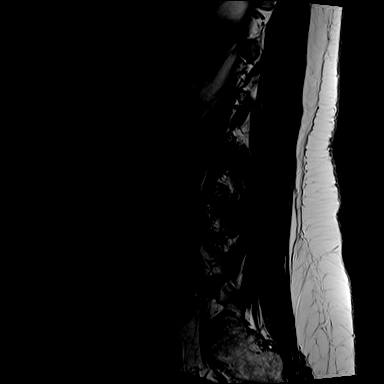
[im 16/16]
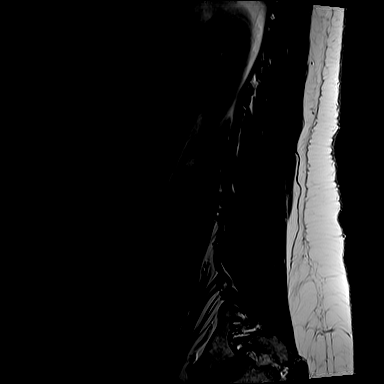

[Series 3: T1 · sagittal · 4.0mm · 0.68mm/px · 7 of 16 slices shown (1 of 2)]
[im 1/16]
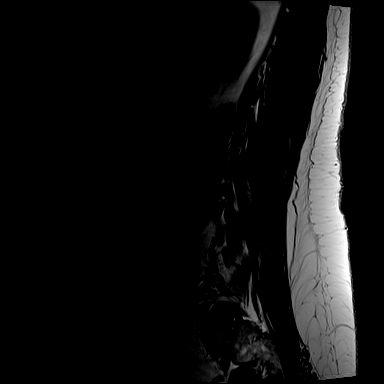
[im 3/16]
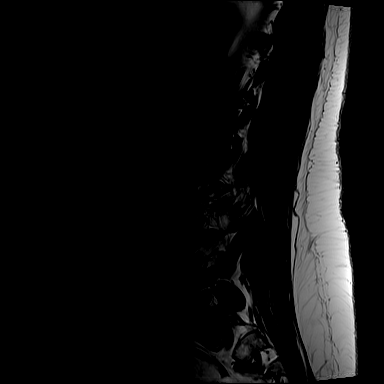
[im 6/16]
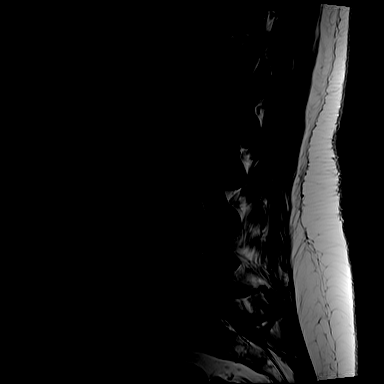
[im 8/16]
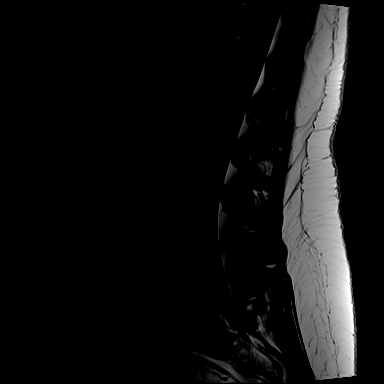
[im 11/16]
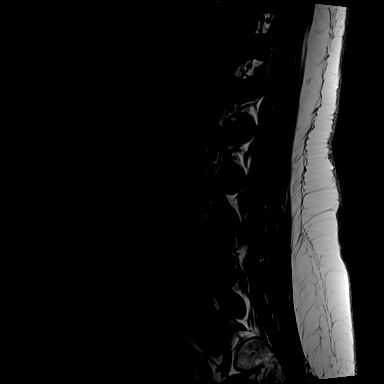
[im 13/16]
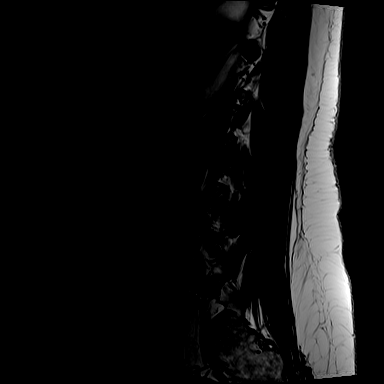
[im 16/16]
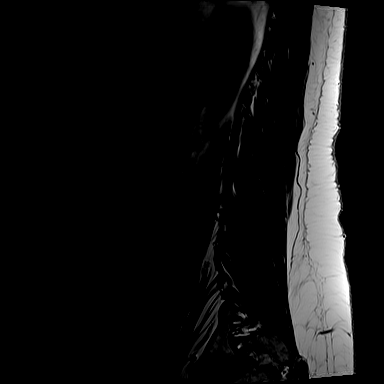

[Series 5: T2 · axial · 4.0mm · 0.62mm/px · z∈[-43,+127]mm · 10 of 37 slices shown (2 of 2)]
[im 3/37]
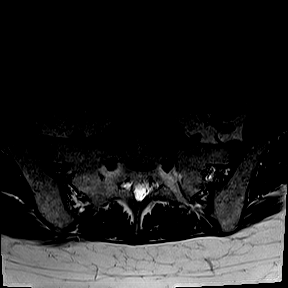
[im 5/37]
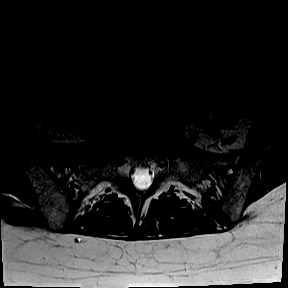
[im 8/37]
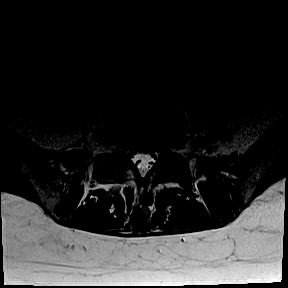
[im 13/37]
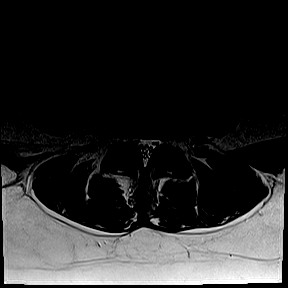
[im 17/37]
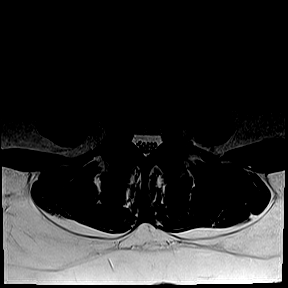
[im 20/37]
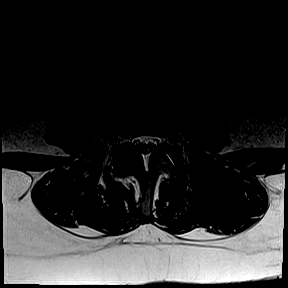
[im 22/37]
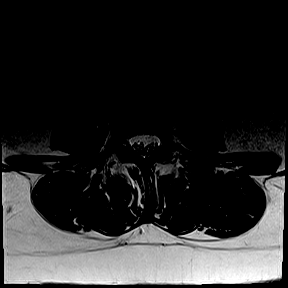
[im 27/37]
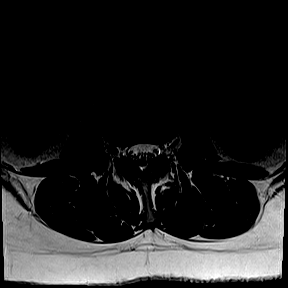
[im 32/37]
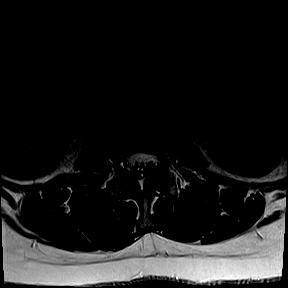
[im 37/37]
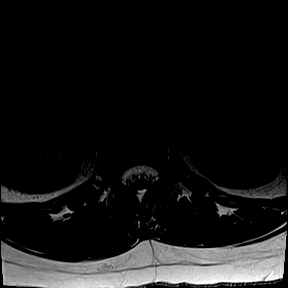

[Series 6: T1 · axial · 4.0mm · 0.35mm/px · z∈[-75,+122]mm · 7 of 25 slices shown (2 of 2)]
[im 1/25]
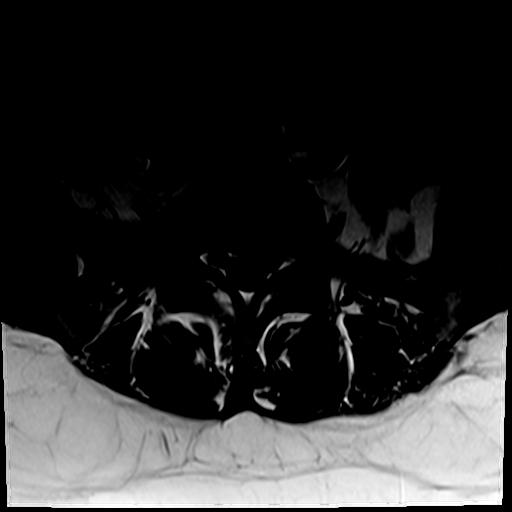
[im 3/25]
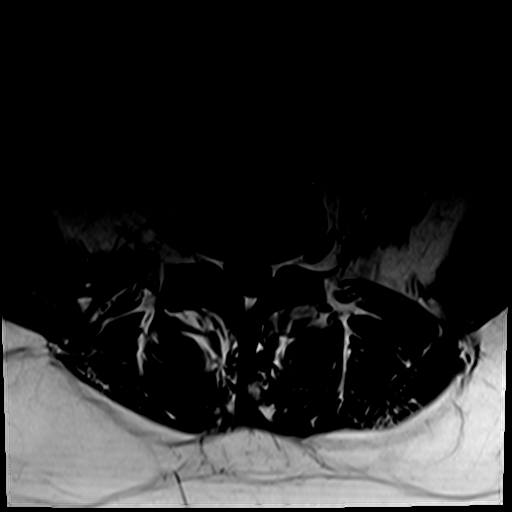
[im 5/25]
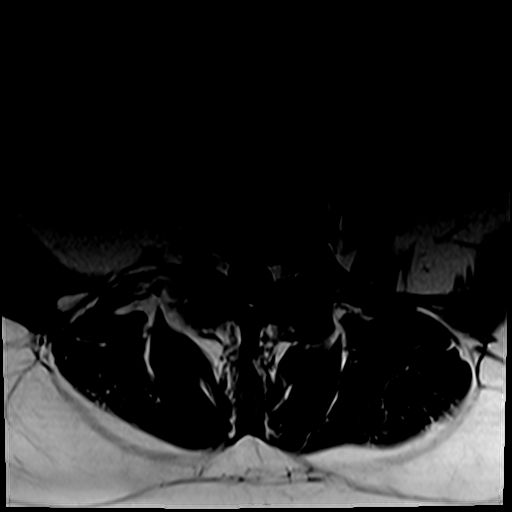
[im 8/25]
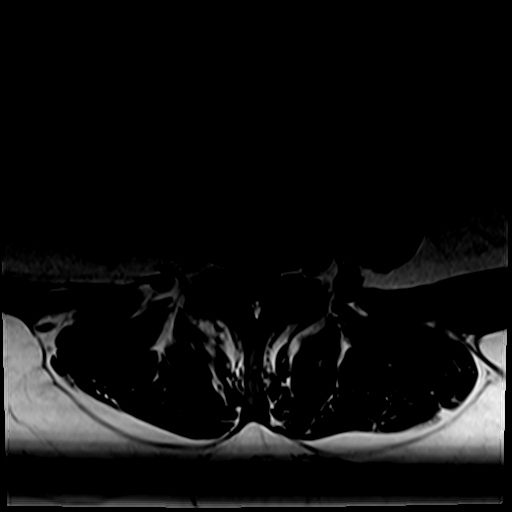
[im 10/25]
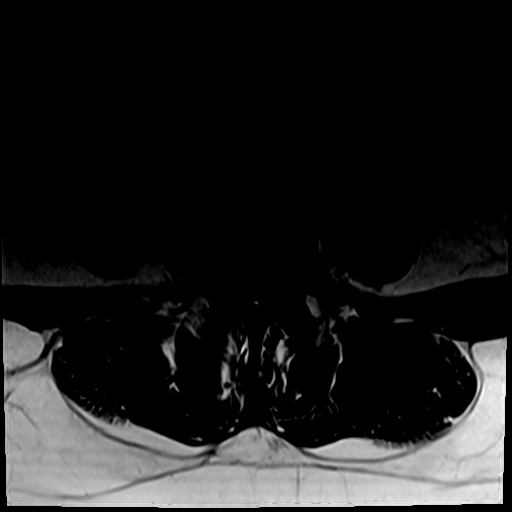
[im 13/25]
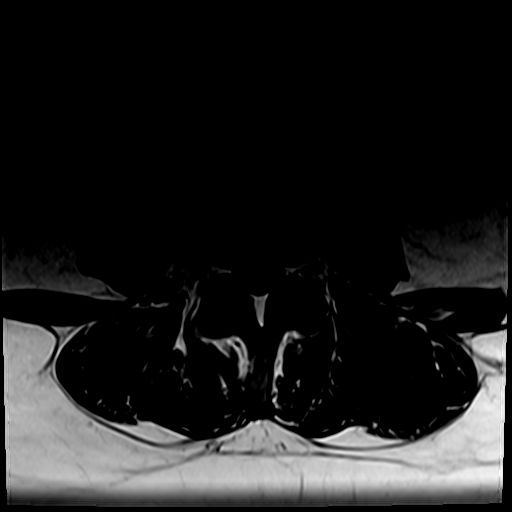
[im 22/25]
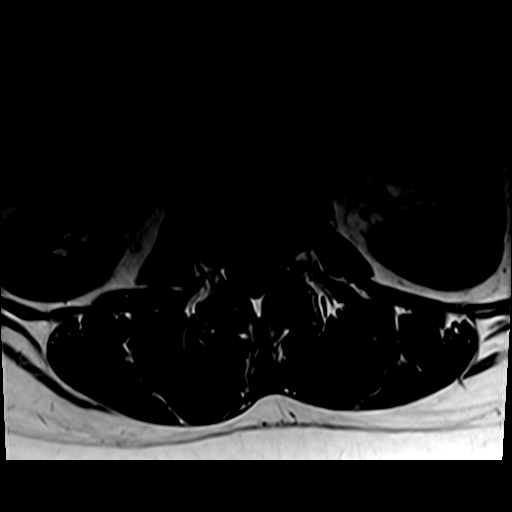

[31 of 48 positions shown; findings below may reference images not displayed]

IMPRESSION: 1. Residual mild to moderate degenerative disc disease at L4-5 and L5-S1.                 
 2. Residual mild canal stenosis L4-5 where there is a mild disc bulge and tiny superiorly 
 extruded central disc herniation.                                                         
 3. The small left paramedian disc protrusion at L4-5 appears slightly less apparent on    
 today's exam. No significant canal stenosis at this level.
